# Patient Record
Sex: Male | Born: 1990 | Race: White | Hispanic: No | Marital: Single | Smoking: Current every day smoker
Health system: Southern US, Community
[De-identification: ages and names within clinical notes are randomized; demographics above are authoritative.]

## PROBLEM LIST (undated history)

## (undated) DIAGNOSIS — J45909 Unspecified asthma, uncomplicated: Secondary | ICD-10-CM

---

## 2016-03-20 ENCOUNTER — Emergency Department (HOSPITAL_BASED_OUTPATIENT_CLINIC_OR_DEPARTMENT_OTHER): Payer: BLUE CROSS/BLUE SHIELD

## 2016-03-20 ENCOUNTER — Encounter (HOSPITAL_BASED_OUTPATIENT_CLINIC_OR_DEPARTMENT_OTHER): Payer: Self-pay | Admitting: *Deleted

## 2016-03-20 ENCOUNTER — Emergency Department (HOSPITAL_BASED_OUTPATIENT_CLINIC_OR_DEPARTMENT_OTHER)
Admission: EM | Admit: 2016-03-20 | Discharge: 2016-03-20 | Disposition: A | Discharge status: Home / Self Care | Payer: BLUE CROSS/BLUE SHIELD | Attending: Emergency Medicine | Admitting: Emergency Medicine

## 2016-03-20 DIAGNOSIS — Y998 Other external cause status: Secondary | ICD-10-CM | POA: Diagnosis not present

## 2016-03-20 DIAGNOSIS — F17200 Nicotine dependence, unspecified, uncomplicated: Secondary | ICD-10-CM | POA: Diagnosis not present

## 2016-03-20 DIAGNOSIS — M25511 Pain in right shoulder: Secondary | ICD-10-CM

## 2016-03-20 DIAGNOSIS — Y92410 Unspecified street and highway as the place of occurrence of the external cause: Secondary | ICD-10-CM | POA: Insufficient documentation

## 2016-03-20 DIAGNOSIS — Y9389 Activity, other specified: Secondary | ICD-10-CM | POA: Diagnosis not present

## 2016-03-20 DIAGNOSIS — S4991XA Unspecified injury of right shoulder and upper arm, initial encounter: Secondary | ICD-10-CM | POA: Diagnosis present

## 2016-03-20 DIAGNOSIS — W19XXXA Unspecified fall, initial encounter: Secondary | ICD-10-CM

## 2016-03-20 DIAGNOSIS — J45909 Unspecified asthma, uncomplicated: Secondary | ICD-10-CM | POA: Insufficient documentation

## 2016-03-20 HISTORY — DX: Unspecified asthma, uncomplicated: J45.909

## 2016-03-20 MED ORDER — IBUPROFEN 800 MG PO TABS
800.0000 mg | ORAL_TABLET | Freq: Once | ORAL | Status: AC
  Administered 2016-03-20: 800 mg via ORAL
  Filled 2016-03-20: qty 1

## 2016-03-20 MED ORDER — IBUPROFEN 800 MG PO TABS: 800.0000 mg | ORAL_TABLET | Freq: Three times a day (TID) | ORAL | Status: AC

## 2016-03-20 NOTE — Discharge Instructions (Signed)
Take your medications as prescribed. I recommend eating prior to taking ibuprofen to prevent gastrointestinal side effects. He may apply ice to affected area for 15-20 minutes 3-4 times daily to help with pain. He may continue using your arm sling as needed for comfort however refrain from using the arm sling continuously to prevent right shoulder/arm stiffness. I recommend performing the shoulder exercises/stretches and pendulum swings discussed in the ED. Please follow up with a primary care provider from the Resource Guide provided below in 1 week as needed if your pain has not improved. Please return to the Emergency Department if symptoms worsen or new onset of fever, redness, swelling, numbness, tingling, weakness.
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
## 2016-03-20 NOTE — ED Notes (Addendum)
Pt reports that he cannot take  narcotics.  Placed ice pack over rt clavicle and upper shoulder.
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
